# Patient Record
Sex: Female | Born: 1960 | Race: White | Hispanic: No | Marital: Married | Smoking: Never smoker
Health system: Southern US, Community
[De-identification: ages and names within clinical notes are randomized; demographics above are authoritative.]

## PROBLEM LIST (undated history)

## (undated) ENCOUNTER — Ambulatory Visit: Discharge status: Absent without leave | Payer: BLUE CROSS/BLUE SHIELD

## (undated) DIAGNOSIS — M1990 Unspecified osteoarthritis, unspecified site: Secondary | ICD-10-CM

## (undated) DIAGNOSIS — I10 Essential (primary) hypertension: Secondary | ICD-10-CM

## (undated) HISTORY — PX: JOINT REPLACEMENT: SHX530

## (undated) HISTORY — PX: CHOLECYSTECTOMY: SHX55

---

## 2014-03-22 ENCOUNTER — Ambulatory Visit: Payer: Self-pay | Admitting: Family Medicine

## 2014-04-24 ENCOUNTER — Observation Stay: Payer: Self-pay | Admitting: Internal Medicine

## 2014-04-24 DIAGNOSIS — R079 Chest pain, unspecified: Secondary | ICD-10-CM

## 2014-04-24 LAB — SODIUM: SODIUM: 132 mmol/L — AB (ref 136–145)

## 2014-04-24 LAB — BASIC METABOLIC PANEL
Anion Gap: 0 — ABNORMAL LOW (ref 7–16)
BUN: 15 mg/dL (ref 7–18)
CHLORIDE: 102 mmol/L (ref 98–107)
CREATININE: 1.03 mg/dL (ref 0.60–1.30)
Calcium, Total: 9.7 mg/dL (ref 8.5–10.1)
Co2: 25 mmol/L (ref 21–32)
EGFR (African American): >60
EGFR (Non-African Amer.): >60
Glucose: 94 mg/dL (ref 65–99)
Osmolality: 256 (ref 275–301)
Potassium: 3.6 mmol/L (ref 3.5–5.1)
SODIUM: 127 mmol/L — AB (ref 136–145)

## 2014-04-24 LAB — LIPID PANEL
CHOLESTEROL: 177 mg/dL (ref 0–200)
HDL Cholesterol: 92 mg/dL — ABNORMAL HIGH (ref 40–60)
Ldl Cholesterol, Calc: 66 mg/dL (ref 0–100)
Triglycerides: 95 mg/dL (ref 0–200)
VLDL Cholesterol, Calc: 19 mg/dL (ref 5–40)

## 2014-04-24 LAB — HEPATIC FUNCTION PANEL A (ARMC)
ALBUMIN: 4 g/dL (ref 3.4–5.0)
ALK PHOS: 126 U/L — AB
ALT: 24 U/L
BILIRUBIN TOTAL: 0.3 mg/dL (ref 0.2–1.0)
Bilirubin, Direct: 0.1 mg/dL (ref 0.00–0.20)
SGOT(AST): 25 U/L (ref 15–37)
Total Protein: 7.8 g/dL (ref 6.4–8.2)

## 2014-04-24 LAB — CBC
HCT: 46.2 % (ref 35.0–47.0)
HGB: 15.4 g/dL (ref 12.0–16.0)
MCH: 30.7 pg (ref 26.0–34.0)
MCHC: 33.3 g/dL (ref 32.0–36.0)
MCV: 92 fL (ref 80–100)
Platelet: 289 10*3/uL (ref 150–440)
RBC: 5.01 10*6/uL (ref 3.80–5.20)
RDW: 12.9 % (ref 11.5–14.5)
WBC: 7.1 10*3/uL (ref 3.6–11.0)

## 2014-04-24 LAB — TROPONIN I
Troponin-I: <0.02 ng/mL
Troponin-I: <0.02 ng/mL
Troponin-I: <0.02 ng/mL

## 2014-04-24 LAB — CREATININE, URINE, RANDOM: CREATININE, URINE RANDOM: 23.9 mg/dL — AB (ref 30.0–125.0)

## 2014-04-24 LAB — SODIUM, URINE, RANDOM: SODIUM, URINE RANDOM: 47 mmol/L (ref 20–110)

## 2014-04-24 LAB — OSMOLALITY, URINE: OSMOLALITY: 193 mosm/kg

## 2014-04-24 LAB — TSH: THYROID STIMULATING HORM: 1.71 u[IU]/mL

## 2014-04-28 ENCOUNTER — Encounter: Payer: Self-pay | Admitting: Cardiovascular Disease

## 2014-11-29 NOTE — Discharge Summary (Signed)
PATIENT NAME:  Daisy Blair, Daisy Blair MR#:  [id number] DATE OF BIRTH:  08-27-1960  DATE OF ADMISSION:  04/24/2014 DATE OF DISCHARGE:  04/25/2014  CHIEF COMPLAINT AT THE TIME OF ADMISSION: Chest pain, fatigue and elevated blood pressures.   ADMITTING DIAGNOSES:  1.  Atypical chest pain.  2.  Uncontrolled hypertension.  3.  Mild hyponatremia, asymptomatic.   DISCHARGE DIAGNOSES:   1.  Chest pain, ruled out acute myocardial infarction, Myoview stress test was negative.  2.  Uncontrolled hypertension, new onset, the patient is started on medications.  3.  Hyponatremia, resolved.   CONSULTATIONS: None.   PROCEDURES: Myoview stress test by Dr. Kirke CorinArida and Dr. Windell HummingbirdGollan's group which is negative.   BRIEF HISTORY AND PHYSICAL AND HOSPITAL COURSE: The patient is a 54 year old Caucasian female who came into the ED with a chief complaint of fatigue for 2 days and midsternal chest pain. The patient's sodium was at 127 at the time of admission. Her blood pressure was also elevated. Please review history and physical for details dictated by Dr. Elpidio AnisSudini.    The patient was admitted to the hospital.    Atypical chest pain most likely from uncontrolled hypertension. Cardiac biomarkers were recycled, acute MI is ruled out. Myoview stress test was ordered which was done by Dr. Mariah MillingGollan and which turned out to be negative. TSH was normal.   Hyponatremia, was assumed to be from poor p.o. intake while trying to lose weight. With IV fluids the patient's sodium trended up to 132. The patient was asymptomatic.   Uncontrolled hypertension. The patient was started on lisinopril and Toprol. Hydrochlorothiazide was not considered as the patient already has hyponatremia.   Estimated ejection fraction on stress test was at 55%.   As the patient's chest pain was resolved she was discharged under stable condition. Pain was assumed to be from uncontrolled hypertension.   MEDICATIONS AT THE TIME OF DISCHARGE: Mobic 15 mg 1 tablet  p.o. once a day, One A Day Women's 50 + 1 tablet p.o. once daily, lisinopril 20 mg p.o. once daily, metoprolol succinate 20 mg p.o. once daily, aspirin 81 mg p.o. once daily.    DIET:  Low sodium.     ACTIVITY: As tolerated.    FOLLOWUP:  With primary care physician in a week.   LABORATORIES AND IMAGING STUDIES: Troponin is less than 0.02 x 3.  TSH is 1.71. CBC is normal. Urine sodium is 47, urine osmolality 193.  Urine random creatinine is 23.9.  Total cholesterol 177, triglycerides 95, HDL is 92,   Sodium went up from 127 to 132, potassium is normal.   The plan of care was discussed in detail with the patient, she is aware of the plan.   TOTAL TIME SPENT ON THE DISCHARGE: 25 minutes.     ____________________________ Ramonita LabAruna Kalla Watson, MD ag:bu D: 04/29/2014 13:37:25 ET T: 04/29/2014 14:08:11 ET JOB#: [id number]  cc: Ramonita LabAruna Corinn Stoltzfus, MD, <Dictator> Ramonita LabARUNA Chijioke Lasser MD ELECTRONICALLY SIGNED 05/07/2014 16:22

## 2014-11-29 NOTE — H&P (Signed)
PATIENT NAME:  Daisy Blair, Daisy Blair MR#:  [id number] DATE OF BIRTH:  1960-10-30  DATE OF ADMISSION:  04/24/2014  PRIMARY CARE PHYSICIAN: None.   CHIEF COMPLAINT: Chest pain, fatigue, elevated blood pressure.   HISTORY OF PRESENT ILLNESS: A 54 year old Caucasian female patient with untreated hypertension, presents to the Emergency Room with complaints of fatigue for 2 days and midsternal chest pain. The patient has done well, other than having some elevated blood pressure, which she was not being treated for.  But, 2 days back, she noticed that she had generalized weakness. Also, had on-and-off chest pain, which is nonexertional, lasted about 5 to 10 minutes. Presently, she complains of some mild pain. This seems to have improved once her blood pressure was controlled in the Emergency Room. Does not have any nausea or vomiting. No shortness of breath. No radiation with no aggravating or relieving factors. She did take a baby aspirin at home earlier today. She went to see Dr. Evelene Croon at the [facility] and was referred to the Emergency Room. Here, the patient has been found to have a sodium of 127 with chest pain and referred to the hospitalist group.   The patient mentions that she is on a low-calorie diet, trying to lose weight, which she has been successful with.  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 No recent change in medications. Has been taking enough fluids.   PAST MEDICAL HISTORY: 1.  Untreated hypertension.  2.  Arthritis.   SOCIAL HISTORY: The patient does not smoke. No alcohol. No illicit drug use. Works as a Armed forces technical officer.   CODE STATUS: Full code.   FAMILY HISTORY: Of hypertension and atrial fibrillation in mother. Father had valve replacements. Multiple uncles had myocardial infarctions, but no premature CAD in the family.   ALLERGIES: None.   HOME MEDICATIONS ARE: 1.  Multivitamin 1 tablet daily.  2.  Mobic 15 mg oral once a day.   REVIEW OF SYSTEMS:    CONSTITUTIONAL: Complains of fatigue, weakness, intentional weight loss.  EYES: No blurred vision, pain, redness.  ENT: No tinnitus, ear pain, hearing loss.  RESPIRATORY: No cough, wheeze, hemoptysis.  CARDIOVASCULAR: Has chest pain, orthopnea, edema.  GASTROINTESTINAL: No nausea, vomiting, diarrhea, abdominal pain.  GENITOURINARY:  No dysuria, hematuria, or frequency.  ENDOCRINE: No polyuria. No thyroid problems.  HEMATOLOGIC AND LYMPHATICS: No anemia, easy bruising or bleeding.  INTEGUMENTARY: No acne, rash, lesion.  MUSCULOSKELETAL: Has arthritis.  NEUROLOGIC: No focal numbness, weakness, seizure.  PSYCHIATRIC: No anxiety or depression.   PHYSICAL EXAMINATION: VITAL SIGNS: Shows temperature 98.5, pulse of 78, blood pressure 174/131, presently 134/90, saturating 97% on room air.  GENERAL: Obese, Caucasian female patient lying in bed, seems comfortable, conversational, cooperative with exam.  PSYCHIATRIC: Alert and oriented x3.  Mood and affect appropriate. Judgment intact.  HEENT: Atraumatic, normocephalic. Oral mucosa moist and pink. External ears and nose normal. No pallor. No icterus. Pupils bilaterally equal and reactive to light.  NECK: Supple. No thyromegaly or palpable lymph nodes. Trachea midline.  No carotid bruits. No JVD.   CARDIOVASCULAR: S1, S2, without any murmurs. Peripheral pulses 2+. No edema and no chest wall tenderness.  RESPIRATORY: Normal work of breathing. Clear to auscultation on both sides.   GASTROINTESTINAL: Soft abdomen, nontender. Bowel sounds present. No hepatosplenomegaly  palpable.  SKIN:  Warm and dry. No petechiae, rash, ulcers.  MUSCULOSKELETAL: No joint tenderness in large joints. Normal muscle tone.   NEUROLOGICAL: Motor strength 5/5 in upper and lower extremities.  LYMPHATICS:  No cervical lymphadenopathy.   LABORATORY DATA: Show glucose of 94, BUN 15, creatinine 1.03 with sodium 127, potassium 3.6. AST, ALT, alkaline phosphatase, bilirubin normal.  Troponin less than 0.02. TSH of 1.71.  WBC of 7.1, hemoglobin 15.4, platelets of 289.   IMAGING: Chest x-ray PA and lateral shows no acute disease. Mild degenerative changes in the thoracic spine.  EKG shows normal sinus rhythm, nothing acute.   ASSESSMENT AND PLAN: 1.  Atypical chest pain. The patient with uncontrolled hypertension, which has not been treated for a while. We will admit the patient on a telemetry floor. Get 2 more sets of troponin. We will schedule her for a stress test tomorrow considering her risk factors and symptoms.  2.  Uncontrolled hypertension. Start the patient on this lisinopril and Toprol. Although  hydrochlorothiazide would have been a good choice, she does have some mild hyponatremia.  3.  Mild hyponatremia, asymptomatic. The patient's sodium is 127.  It could be likely secondary to low solute load  due  to decreased food intake for her weight loss. We will check a urine sodium, creatinine, and urine osmolality. For now, just monitor.  4.  Deep vein thrombosis prophylaxis with Lovenox.   CODE STATUS: Full code.   TIME SPENT: Today, on this case was 35.     ____________________________ Molinda BailiffSrikar R. Anthem Frazer, MD srs:lr D: 04/24/2014 17:57:09 ET T: 04/24/2014 19:22:25 ET JOB#: [id number]  cc: Wardell HeathSrikar R. Joon Pohle, MD, <Dictator> Suszanne ConnersMichael R. Evelene CroonWolff, MD Orie FishermanSRIKAR R Britten Parady MD ELECTRONICALLY SIGNED 04/26/2014 14:33

## 2016-02-10 IMAGING — CR DG CHEST 2V
1 series · 2 of 2 positions shown · non-contrast
Comparison: None.

CLINICAL DATA: Blurred vision, high blood pressure, chest pain

EXAM:
CHEST  2 VIEW

[Series 1: dxr chest pa (or ap) and lateral · 0.14mm/px · 2 of 2 slices shown]
[im 1/2]
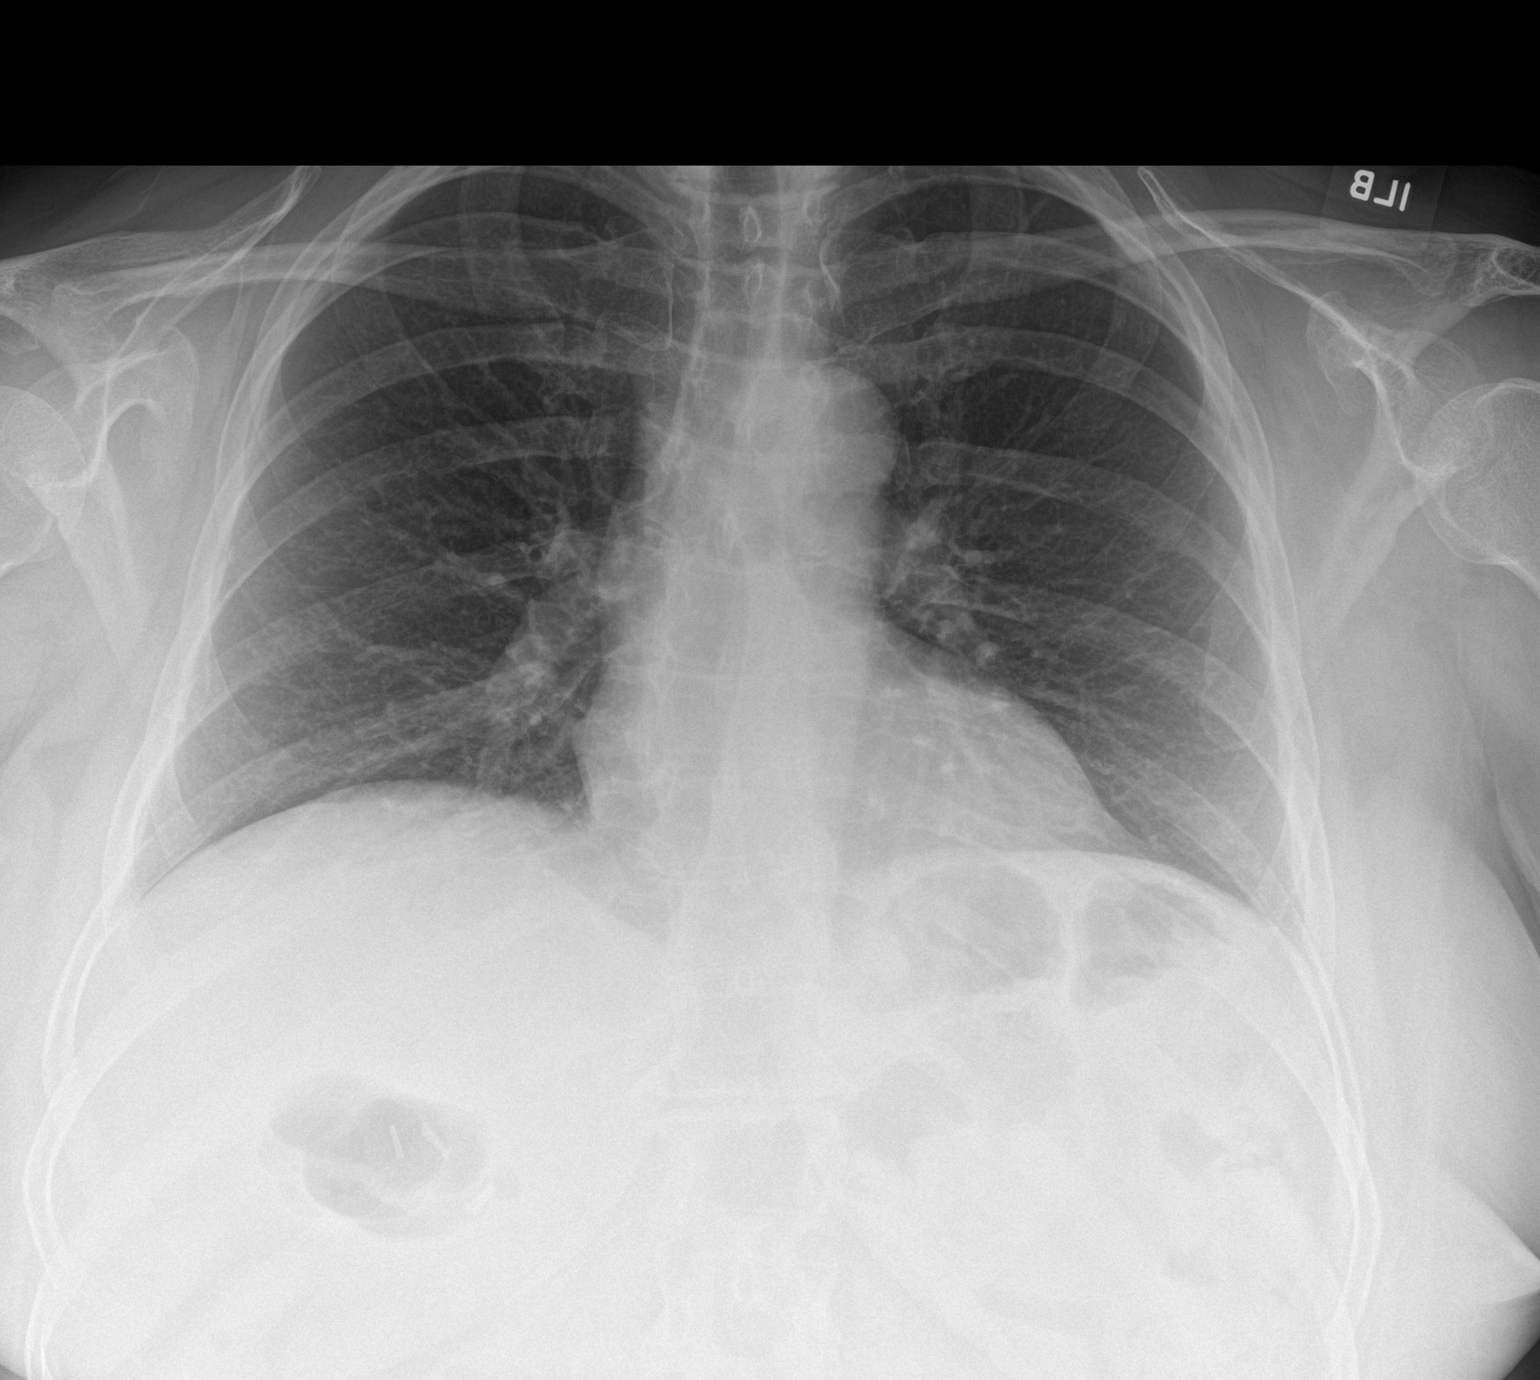
[im 2/2]
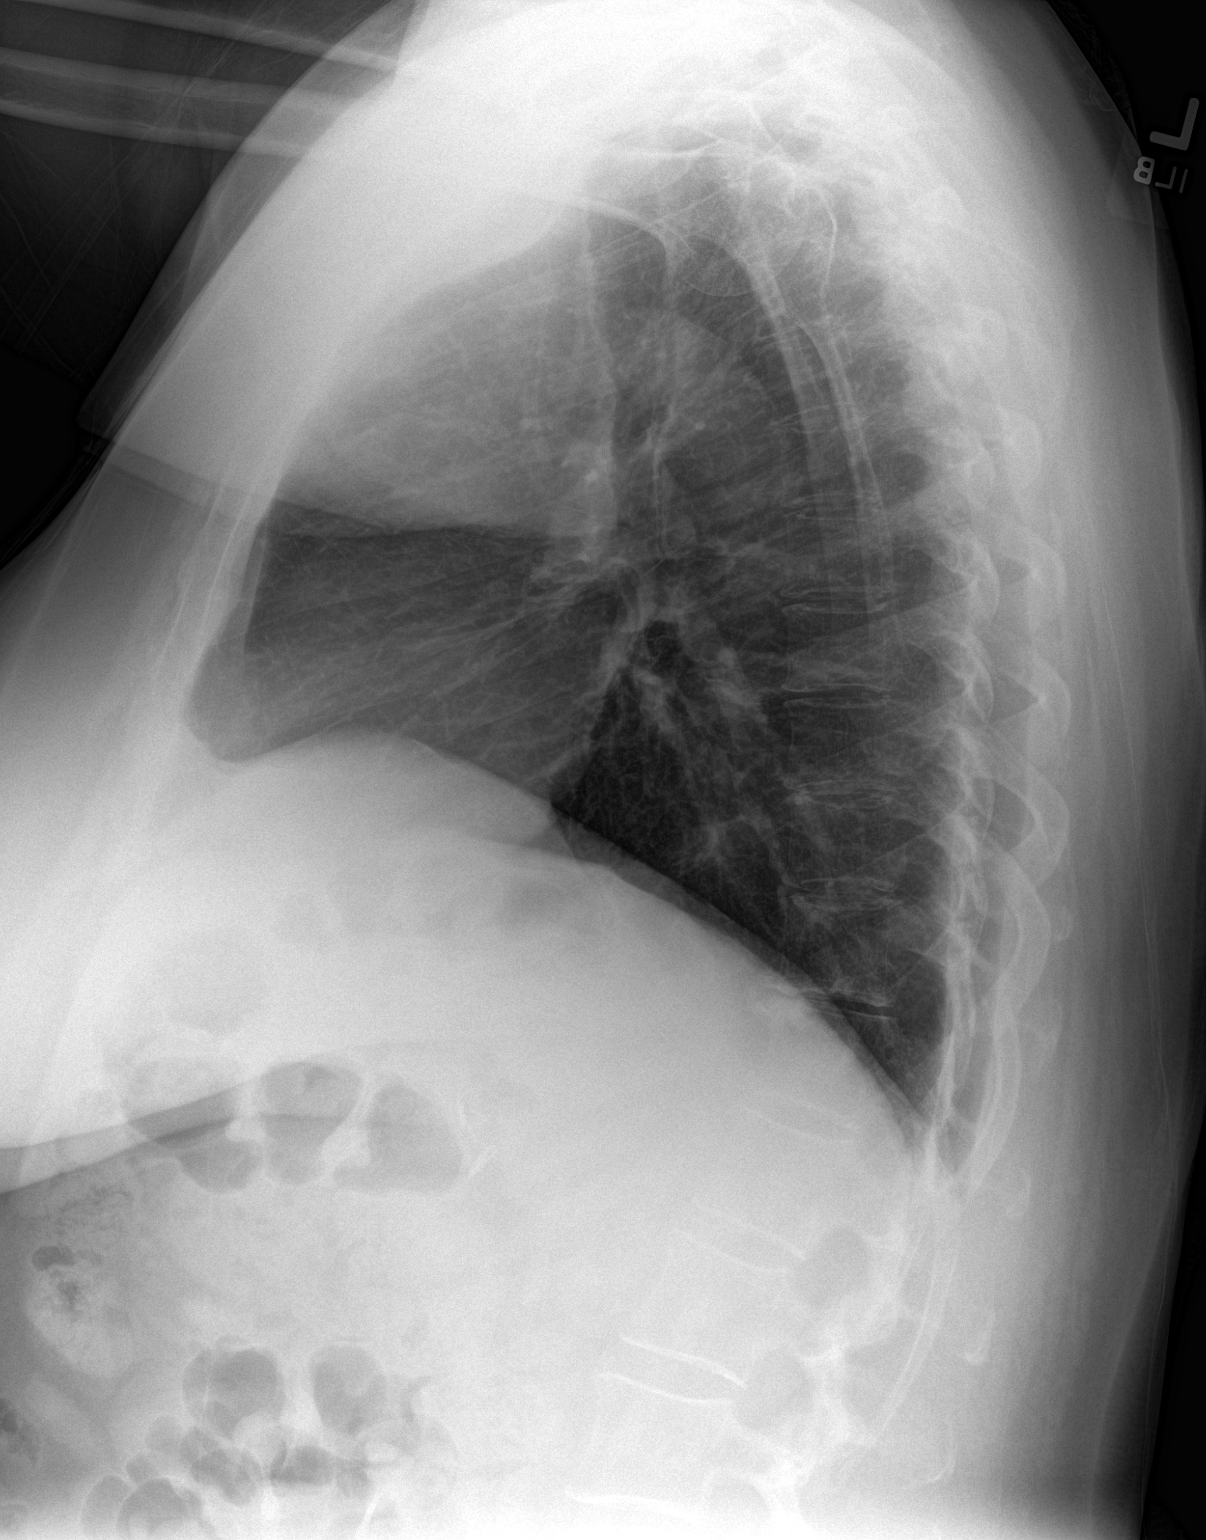

[2 of 2 positions shown; findings below may reference images not displayed]

FINDINGS: Cardiomediastinal silhouette is unremarkable. No acute infiltrate or
pleural effusion. No pulmonary edema. Mild degenerative changes
thoracic spine.
IMPRESSION: No active cardiopulmonary disease.

## 2017-07-13 ENCOUNTER — Ambulatory Visit
Admission: EM | Admit: 2017-07-13 | Discharge: 2017-07-13 | Disposition: A | Discharge status: Home / Self Care | Payer: BLUE CROSS/BLUE SHIELD | Attending: Family Medicine | Admitting: Family Medicine

## 2017-07-13 ENCOUNTER — Encounter: Payer: Self-pay | Admitting: *Deleted

## 2017-07-13 ENCOUNTER — Other Ambulatory Visit: Payer: Self-pay

## 2017-07-13 DIAGNOSIS — M25562 Pain in left knee: Secondary | ICD-10-CM | POA: Diagnosis not present

## 2017-07-13 DIAGNOSIS — G8929 Other chronic pain: Secondary | ICD-10-CM

## 2017-07-13 HISTORY — DX: Essential (primary) hypertension: I10

## 2017-07-13 HISTORY — DX: Unspecified osteoarthritis, unspecified site: M19.90

## 2017-07-13 MED ORDER — OXYCODONE-ACETAMINOPHEN 5-325 MG PO TABS: 1.0000 | ORAL_TABLET | Freq: Three times a day (TID) | ORAL | 0 refills | Status: DC | PRN

## 2017-07-13 MED ORDER — PREDNISONE 10 MG PO TABS: ORAL_TABLET | ORAL | 0 refills | Status: DC

## 2017-07-13 NOTE — ED Provider Notes (Signed)
[facility] ____________________________________________  Time seen: Approximately 1:03 PM  I have reviewed the triage vital signs and the nursing notes.   HISTORY  Chief Complaint Knee Pain   HPI Daisy Blair is a 56 y.o. female with a past medical history of arthritis, hypertension and chronic knee pain, presenting for evaluation of left knee pain.  Patient reports she has been having a lot of issues with her left knee, and is planning to have a left knee replacement early this coming year.  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  Patient reports she had a steroid injection to left knee approximately 2 weeks ago which did help some, but reports over the last week she has been helping her dad more he just came out of rehab as well as a continue to work, and states that she feels this has flared her left knee pain back up.  States that she has been taking Mobic 7.5 daily, sometimes takes 2 a day.  Reports the pain continues.  States pain is primarily with active movement and walking.  Denies pain radiation, paresthesias, fall, known injury, abrupt onset, calf pain, other pain in the leg, paresthesias, skin changes.  Reports his continue remain active.  States the pain does make it difficult to walk at times and has intermittently use her dad's walker.  States minimal pain at rest at this time.  States previous use of prednisone helped better with similar flares.  Denies chest pain, shortness of breath, fevers, calf pain, recent immobilization, recent surgery, abdominal pain, dysuria, or rash. Denies recent sickness. Denies recent antibiotic use.  Denies cardiac history.  Denies renal insufficiency.  Denies diabetes.  Orthopedic at [facility]   Past Medical History:  Diagnosis Date  . Arthritis   . Hypertension     There are no active problems to display for this patient.   Past Surgical History:  Procedure Laterality Date  . CHOLECYSTECTOMY    . JOINT REPLACEMENT        No current facility-administered medications for this encounter.   Current Outpatient Medications:  .  estradiol (ESTRACE) 0.1 MG/GM vaginal cream, Place 1 Applicatorful vaginally at bedtime., Disp: , Rfl:  .  lisinopril-hydrochlorothiazide (PRINZIDE,ZESTORETIC) 10-12.5 MG tablet, Take 1 tablet by mouth daily., Disp: , Rfl:  .  meloxicam (MOBIC) 7.5 MG tablet, Take 7.5 mg by mouth daily., Disp: , Rfl:  .  oxyCODONE-acetaminophen (ROXICET) 5-325 MG tablet, Take 1 tablet by mouth every 8 (eight) hours as needed for moderate pain or severe pain (Do not drive or operate heavy machinery while taking as can cause drowsiness.)., Disp: 3 tablet, Rfl: 0 .  predniSONE (DELTASONE) 10 MG tablet, Start 60 mg po day one, then 50 mg po day two, taper by 10 mg daily until complete., Disp: 21 tablet, Rfl: 0  Allergies Patient has no known allergies.  Family History  Problem Relation Age of Onset  . Cancer Mother   . Hypertension Mother   . Rheum arthritis Father     Social History Social History   Tobacco Use  . Smoking status: Never Smoker  . Smokeless tobacco: Never Used  Substance Use Topics  . Alcohol use: No    Frequency: Never  . Drug use: No    Review of Systems Constitutional: No fever/chills Cardiovascular: Denies chest pain. Respiratory: Denies shortness of breath. Gastrointestinal: No abdominal pain.   Musculoskeletal: Negative for back pain. As above.  Skin: Negative for rash.   ____________________________________________   PHYSICAL EXAM:  VITAL  SIGNS: ED Triage Vitals  Enc Vitals Group     BP 07/13/17 1248 (!) 174/106     Pulse Rate 07/13/17 1248 93     Resp -- 18     Temp 07/13/17 1248 98.3 F (36.8 C)     Temp Source 07/13/17 1248 Oral     SpO2 07/13/17 1248 100 %     Weight 07/13/17 1249 230 lb (104.3 kg)     Height 07/13/17 1249  (1.651 m)     Head Circumference --      Peak Flow --      Pain Score 07/13/17 1249 7     Pain Loc --      Pain  Edu? --      Excl. in GC? --     Constitutional: Alert and oriented. Well appearing and in no acute distress. Cardiovascular: Normal rate, regular rhythm. Grossly normal heart sounds.  Good peripheral circulation. Respiratory: Normal respiratory effort without tachypnea nor retractions. Breath sounds are clear and equal bilaterally. No wheezes, rales, rhonchi. Gastrointestinal: Soft and nontender. No distention. Normal Bowel sounds. No CVA tenderness. Musculoskeletal: Mild antalgic gait.  Bilateral pedal pulses equal and easily palpated.  Except: Left anterior mid knee mild diffuse tenderness to palpation, no pain with anterior posterior drawer test, no pain with medial or lateral stress, no erythema, skin intact, minimal effusion, mild pain with resisted knee extension and flexion as well as moderate pain with weightbearing, no instability noted, left lower extremity otherwise nontender. Neurologic:  Normal speech and language. Speech is normal. No gait instability.  Skin:  Skin is warm, dry and intact. No rash noted. Psychiatric: Mood and affect are normal. Speech and behavior are normal. Patient exhibits appropriate insight and judgment   ___________________________________________   LABS (all labs ordered are listed, but only abnormal results are displayed)  Labs Reviewed - No data to display  PROCEDURES Procedures    INITIAL IMPRESSION / ASSESSMENT AND PLAN / ED COURSE  Pertinent labs & imaging results that were available during my care of the patient were reviewed by me and considered in my medical decision making (see chart for details).  Well-appearing patient.  No acute distress.  Acute on chronic left knee pain.  Patient follows with orthopedic and recommended to continue to follow with orthopedic as soon as possible.  Patient had a Kenalog injection approximately 2 weeks ago, but with recent increased activity in helping her father.  Patient reports previous improvement with  prednisone and as she is Artie been taking Mobic twice a day, will do 6-day taper of prednisone quantity 3 Percocet given as needed for breakthrough pain.  Encouraged rest, supportive care.  Patient declined need for crutches or walker from urgent care.Discussed indication, risks and benefits of medications with patient.   [place] controlled substance database reviewed for last one year, and last was 06/30/17 #12 norco.  Discussed follow up with Primary care physician this week. Discussed follow up and return parameters including no resolution or any worsening concerns. Patient verbalized understanding and agreed to plan.   ____________________________________________   FINAL CLINICAL IMPRESSION(S) / ED DIAGNOSES  Final diagnoses:  Chronic pain of left knee     ED Discharge Orders        Ordered    predniSONE (DELTASONE) 10 MG tablet     07/13/17 1311    oxyCODONE-acetaminophen (ROXICET) 5-325 MG tablet  Every 8 hours PRN     07/13/17 1311  Note: This dictation was prepared with Dragon dictation along with smaller phrase technology. Any transcriptional errors that result from this process are unintentional.         Renford DillsMiller, Nickalous Stingley, NP 07/13/17 1721

## 2017-07-13 NOTE — Discharge Instructions (Signed)
Take medication as prescribed. Rest. Ice.  Follow up with your orthopedic as soon as possible.  Follow up with your primary care physician this week as needed. Return to Urgent care for new or worsening concerns.

## 2017-07-13 NOTE — ED Triage Notes (Signed)
PAtient has chronic left knee pain that became severe yesterday. Patient will be seeing her surgeon about a knee replacement surgery.

## 2022-11-19 ENCOUNTER — Ambulatory Visit
Admission: EM | Admit: 2022-11-19 | Discharge: 2022-11-19 | Disposition: A | Discharge status: Home / Self Care | Payer: BC Managed Care – PPO | Attending: Family Medicine | Admitting: Family Medicine

## 2022-11-19 DIAGNOSIS — H1031 Unspecified acute conjunctivitis, right eye: Secondary | ICD-10-CM

## 2022-11-19 MED ORDER — CIPROFLOXACIN HCL 0.3 % OP SOLN: 1.0000 [drp] | Freq: Four times a day (QID) | OPHTHALMIC | 0 refills | Status: AC

## 2022-11-19 NOTE — Discharge Instructions (Signed)
Antibiotic as prescribed.  See your Eye doctor this week.  No contacts at this time.

## 2022-11-19 NOTE — ED Triage Notes (Signed)
Pt states that she has some right eye redness, pain an sensitivity. X1 week

## 2022-11-19 NOTE — ED Provider Notes (Signed)
[facility]    CSN: [id number] Arrival date & time: 11/19/22  1212      History   Chief Complaint Chief Complaint  Patient presents with   Conjunctivitis    Right pain, redness and sensitivity. x1week    HPI 62 year old female presents for evaluation of the above.  Patient reports right eye pain, redness, sensitivity and crusting in the morning.  This has been going over the past week.  She is a contact lens wearer.  She has removed her contacts.  No known inciting factor.  No relieving factors.  No other complaints.  Home Medications    Prior to Admission medications   Medication Sig Start Date End Date Taking? Authorizing Provider  cephALEXin (KEFLEX) 500 MG capsule Take 500 mg by mouth 2 (two) times daily. 11/16/22  Yes [provider]  ciprofloxacin (CILOXAN) 0.3 % ophthalmic solution Place 1 drop into the right eye every 6 (six) hours. 11/19/22  Yes Zaryan Yakubov G, DO  losartan-hydrochlorothiazide (HYZAAR) 50-12.5 MG tablet Take 1 tablet by mouth daily. 09/21/22  Yes [provider]  methocarbamol (ROBAXIN) 750 MG tablet Take 750 mg by mouth 2 (two) times daily as needed. 11/16/22  Yes [provider]  senna-docusate (SENOKOT-S) 8.6-50 MG tablet Take by mouth. 01/17/20  Yes [provider]  WEGOVY 2.4 MG/0.75ML SOAJ SMARTSIG:2.4 Milligram(s) SUB-Q Once a Week 10/19/22  Yes [provider]  estradiol (ESTRACE) 0.1 MG/GM vaginal cream Place 1 Applicatorful vaginally at bedtime.    [provider]  meloxicam (MOBIC) 7.5 MG tablet Take 7.5 mg by mouth daily.    [provider]    Family History Family History  Problem Relation Age of Onset   Cancer Mother    Hypertension Mother    Rheum arthritis Father     Social History Social History   Tobacco Use   Smoking status: Never   Smokeless tobacco: Never  Vaping Use   Vaping Use: Never used  Substance Use Topics   Alcohol use: No   Drug use: No      Allergies   Patient has no known allergies.   Review of Systems Review of Systems Per HPI  Physical Exam Triage Vital Signs ED Triage Vitals  Enc Vitals Group     BP 11/19/22 1224 (!) 134/91     Pulse Rate 11/19/22 1224 61     Resp 11/19/22 1224 16     Temp 11/19/22 1224 97.8 F (36.6 C)     Temp Source 11/19/22 1224 Oral     SpO2 11/19/22 1224 95 %     Weight 11/19/22 1221 189 lb (85.7 kg)     Height 11/19/22 1221  (1.651 m)     Head Circumference --      Peak Flow --      Pain Score 11/19/22 1221 6     Pain Loc --      Pain Edu? --      Excl. in GC? --    No data found.  Updated Vital Signs BP (!) 134/91 (BP Location: Right Arm)   Pulse 61   Temp 97.8 F (36.6 C) (Oral)   Resp 16   Ht  (1.651 m)   Wt 85.7 kg   SpO2 95%   BMI 31.45 kg/m   Visual Acuity Right Eye Distance:   Left Eye Distance:   Bilateral Distance:    Right Eye Near:   Left Eye Near:    Bilateral  Near:     Physical Exam Vitals and nursing note reviewed.  Constitutional:      General: She is not in acute distress.    Appearance: Normal appearance.  Eyes:     Comments: Right eye with mild conjunctival injection.  No current discharge.  No visible foreign body.  Cardiovascular:     Rate and Rhythm: Normal rate and regular rhythm.  Pulmonary:     Effort: Pulmonary effort is normal.     Breath sounds: Normal breath sounds. No wheezing, rhonchi or rales.  Neurological:     Mental Status: She is alert.      UC Treatments / Results  Labs (all labs ordered are listed, but only abnormal results are displayed) Labs Reviewed - No data to display  EKG   Radiology No results found.  Procedures Procedures (including critical care time)  Medications Ordered in UC Medications - No data to display  Initial Impression / Assessment and Plan / UC Course  I have reviewed the triage vital signs and the nursing notes.  Pertinent labs & imaging results that were  available during my care of the patient were reviewed by me and considered in my medical decision making (see chart for details).    62 year old female presents with conjunctivitis.  Placing on Cipro given the fact that she is a contact lens wear.  Advised to follow-up with her ophthalmologist.  Final Clinical Impressions(s) / UC Diagnoses   Final diagnoses:  Acute conjunctivitis of right eye, unspecified acute conjunctivitis type     Discharge Instructions      Antibiotic as prescribed.  See your Eye doctor this week.  No contacts at this time.   ED Prescriptions     Medication Sig Dispense Auth. Provider   ciprofloxacin (CILOXAN) 0.3 % ophthalmic solution Place 1 drop into the right eye every 6 (six) hours. 5 mL Tommie Sams, DO      PDMP not reviewed this encounter.   Tommie Sams, DO 11/19/22 1255
